# Patient Record
Sex: Female | Born: 2005 | Hispanic: Yes | Marital: Single | Smoking: Never smoker
Health system: Southern US, Community
[De-identification: ages and names within clinical notes are randomized; demographics above are authoritative.]

## PROBLEM LIST (undated history)

## (undated) DIAGNOSIS — J45909 Unspecified asthma, uncomplicated: Secondary | ICD-10-CM

## (undated) DIAGNOSIS — H7290 Unspecified perforation of tympanic membrane, unspecified ear: Secondary | ICD-10-CM

## (undated) DIAGNOSIS — H9190 Unspecified hearing loss, unspecified ear: Secondary | ICD-10-CM

---
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
## 2023-08-07 ENCOUNTER — Ambulatory Visit (HOSPITAL_COMMUNITY)
Admission: RE | Admit: 2023-08-07 | Discharge: 2023-08-07 | Disposition: A | Discharge status: Home / Self Care | Payer: Medicaid Other | Source: Ambulatory Visit | Attending: Family Medicine | Admitting: Family Medicine

## 2023-08-07 ENCOUNTER — Encounter (HOSPITAL_COMMUNITY): Payer: Self-pay

## 2023-08-07 ENCOUNTER — Ambulatory Visit (HOSPITAL_COMMUNITY): Payer: Self-pay

## 2023-08-07 VITALS — BP 115/79 | HR 90 | Temp 98.8°F | Resp 16

## 2023-08-07 DIAGNOSIS — J019 Acute sinusitis, unspecified: Secondary | ICD-10-CM

## 2023-08-07 MED ORDER — BENZONATATE 100 MG PO CAPS: 100.0000 mg | ORAL_CAPSULE | Freq: Three times a day (TID) | ORAL | 0 refills | Status: DC | PRN

## 2023-08-07 MED ORDER — DOXYCYCLINE HYCLATE 100 MG PO CAPS: 100.0000 mg | ORAL_CAPSULE | Freq: Two times a day (BID) | ORAL | 0 refills | Status: AC

## 2023-08-07 NOTE — ED Provider Notes (Signed)
 [facility]    CSN: [id number] Arrival date & time: 08/07/23  1049      History   Chief Complaint Chief Complaint  Patient presents with   Cough    Entered by patient   Sore Throat   Headache    HPI Toni Burgess is a 17 y.o. female.    Cough Associated symptoms: headaches   Sore Throat Associated symptoms include headaches.  Headache Associated symptoms: cough   Here for cough and congestion and headache.  She has also had some mild sore throat.  Symptoms began December 25.  She has had some subjective fever but first, it improved and then she had fever again last night.  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   Past Surgical History:  Procedure Laterality Date   INCISION AND DRAINAGE ABSCESS Left 11/21/2012   Procedure: EXCISION OF HMJWILONFJ BEHIND LEFT EAR;  Surgeon: Ida Loader, MD;  Location: [facility] OR;  Service: ENT;  Laterality: Left;   TYMPANOPLASTY  08/12/2012   Procedure: TYMPANOPLASTY;  Surgeon: Ida Loader, MD;  Location: [facility];  Service: ENT;  Laterality: Left;   TYMPANOSTOMY TUBE PLACEMENT  11/07/2004    OB History   No obstetric history on file.      Home Medications    Prior to Admission medications   Medication Sig Start Date End Date Taking? Authorizing Provider  benzonatate  (TESSALON ) 100 MG capsule Take 1 capsule (100 mg total) by mouth 3 (three) times daily as needed for cough. 08/07/23  Yes Vonna Sharlet POUR, MD  doxycycline  (VIBRAMYCIN ) 100 MG capsule Take 1 capsule (100 mg total) by mouth 2 (two) times daily for 7 days. 08/07/23 08/14/23 Yes Jakia Kennebrew, Sharlet POUR, MD    Family  History Family History  Problem Relation Age of Onset   Healthy Mother    Healthy Father     Social History Social History   Tobacco Use   Smoking status: Never   Smokeless tobacco: Never  Vaping Use   Vaping status: Never Used  Substance Use Topics   Alcohol use: Never   Drug use: Never     Allergies   Patient has no known allergies.   Review of Systems Review of Systems  Respiratory:  Positive for cough.   Neurological:  Positive for headaches.     Physical Exam Triage Vital Signs ED Triage Vitals  Encounter Vitals Group     BP 08/07/23 1118 115/79     Systolic BP Percentile --      Diastolic BP Percentile --      Pulse Rate 08/07/23 1118 90     Resp 08/07/23 1118 16     Temp 08/07/23 1118 98.8 F (37.1 C)     Temp Source 08/07/23 1118 Oral     SpO2 08/07/23 1118 96 %     Weight --      Height --      Head Circumference --      Peak Flow --      Pain Score 08/07/23 1116 0     Pain  Loc --      Pain Education --      Exclude from Growth Chart --    No data found.  Updated Vital Signs BP 115/79 (BP Location: Left Arm)   Pulse 90   Temp 98.8 F (37.1 C) (Oral)   Resp 16   LMP 07/23/2023   SpO2 96%   Visual Acuity Right Eye Distance:   Left Eye Distance:   Bilateral Distance:    Right Eye Near:   Left Eye Near:    Bilateral Near:     Physical Exam Vitals reviewed.  Constitutional:      General: She is not in acute distress.    Appearance: She is not ill-appearing, toxic-appearing or diaphoretic.  HENT:     Right Ear: Tympanic membrane and ear canal normal.     Left Ear: Tympanic membrane and ear canal normal.     Nose: Congestion present.     Mouth/Throat:     Mouth: Mucous membranes are moist.     Comments: There is clear mucus draining in the oropharynx Eyes:     Extraocular Movements: Extraocular movements intact.     Conjunctiva/sclera: Conjunctivae normal.     Pupils: Pupils are equal, round, and reactive to light.   Cardiovascular:     Rate and Rhythm: Normal rate and regular rhythm.     Heart sounds: No murmur heard. Pulmonary:     Effort: No respiratory distress.     Breath sounds: No stridor. No wheezing, rhonchi or rales.     Comments: Air movement overall is good but there are decreased breath sounds in the right lower lung field Musculoskeletal:     Cervical back: Neck supple.  Lymphadenopathy:     Cervical: No cervical adenopathy.  Skin:    Capillary Refill: Capillary refill takes less than 2 seconds.     Coloration: Skin is not jaundiced or pale.  Neurological:     General: No focal deficit present.     Mental Status: She is alert and oriented to person, place, and time.  Psychiatric:        Behavior: Behavior normal.      UC Treatments / Results  Labs (all labs ordered are listed, but only abnormal results are displayed) Labs Reviewed - No data to display  EKG   Radiology No results found.  Procedures Procedures (including critical care time)  Medications Ordered in UC Medications - No data to display  Initial Impression / Assessment and Plan / UC Course  I have reviewed the triage vital signs and the nursing notes.  Pertinent labs & imaging results that were available during my care of the patient were reviewed by me and considered in my medical decision making (see chart for details).     With nearly a week's worth of symptoms and frontal headache, I am inclined to treat for acute sinusitis.  Going to treat with doxycycline  to cover any potential pneumonia, since her decreased breath sounds on the right  Tessalon  Perles are sent in for cough Final Clinical Impressions(s) / UC Diagnoses   Final diagnoses:  Acute sinusitis, recurrence not specified, unspecified location     Discharge Instructions      Take doxycycline  100 mg --1 capsule 2 times daily for 7 days  Take benzonatate  100 mg, 1 tab every 8 hours as needed for cough.       ED Prescriptions      Medication Sig Dispense Auth. Provider   doxycycline  (VIBRAMYCIN ) 100  MG capsule Take 1 capsule (100 mg total) by mouth 2 (two) times daily for 7 days. 14 capsule Ronnie Mallette K, MD   benzonatate  (TESSALON ) 100 MG capsule Take 1 capsule (100 mg total) by mouth 3 (three) times daily as needed for cough. 21 capsule Izel Hochberg K, MD      PDMP not reviewed this encounter.   Vonna Sharlet POUR, MD 08/07/23 [phone]

## 2023-08-07 NOTE — ED Triage Notes (Signed)
 Pt complains of sore throat, headache, cough since 08/01/2023. She has been taking dayquil and nyquil with some relief.   She denies any pain currently.

## 2023-08-07 NOTE — Discharge Instructions (Signed)
Take doxycycline 100 mg --1 capsule 2 times daily for 7 days  Take benzonatate 100 mg, 1 tab every 8 hours as needed for cough.

## 2024-09-09 ENCOUNTER — Emergency Department (HOSPITAL_COMMUNITY)

## 2024-09-09 ENCOUNTER — Other Ambulatory Visit: Payer: Self-pay

## 2024-09-09 ENCOUNTER — Observation Stay (HOSPITAL_COMMUNITY)
Admission: EM | Admit: 2024-09-09 | Discharge: 2024-09-10 | Disposition: A | Attending: Obstetrics & Gynecology | Admitting: Obstetrics & Gynecology

## 2024-09-09 ENCOUNTER — Encounter (HOSPITAL_COMMUNITY): Payer: Self-pay

## 2024-09-09 DIAGNOSIS — N83209 Unspecified ovarian cyst, unspecified side: Secondary | ICD-10-CM | POA: Diagnosis present

## 2024-09-09 DIAGNOSIS — J45909 Unspecified asthma, uncomplicated: Secondary | ICD-10-CM | POA: Insufficient documentation

## 2024-09-09 DIAGNOSIS — N83202 Unspecified ovarian cyst, left side: Principal | ICD-10-CM | POA: Insufficient documentation

## 2024-09-09 DIAGNOSIS — N83201 Unspecified ovarian cyst, right side: Principal | ICD-10-CM

## 2024-09-09 LAB — URINALYSIS, ROUTINE W REFLEX MICROSCOPIC
Bacteria, UA: NONE SEEN
Bilirubin Urine: NEGATIVE
Glucose, UA: NEGATIVE mg/dL
Hgb urine dipstick: NEGATIVE
Ketones, ur: 5 mg/dL — AB
Leukocytes,Ua: NEGATIVE
Nitrite: NEGATIVE
Protein, ur: 30 mg/dL — AB
Specific Gravity, Urine: 1.02 (ref 1.005–1.030)
pH: 9 — ABNORMAL HIGH (ref 5.0–8.0)

## 2024-09-09 LAB — CBC
HCT: 37.1 % (ref 36.0–46.0)
Hemoglobin: 12.3 g/dL (ref 12.0–15.0)
MCH: 32.1 pg (ref 26.0–34.0)
MCHC: 33.2 g/dL (ref 30.0–36.0)
MCV: 96.9 fL (ref 80.0–100.0)
Platelets: 239 10*3/uL (ref 150–400)
RBC: 3.83 MIL/uL — ABNORMAL LOW (ref 3.87–5.11)
RDW: 12.2 % (ref 11.5–15.5)
WBC: 6.7 10*3/uL (ref 4.0–10.5)
nRBC: 0 % (ref 0.0–0.2)

## 2024-09-09 LAB — COMPREHENSIVE METABOLIC PANEL WITH GFR
ALT: 25 U/L (ref 0–44)
AST: 22 U/L (ref 15–41)
Albumin: 4.6 g/dL (ref 3.5–5.0)
Alkaline Phosphatase: 65 U/L (ref 38–126)
Anion gap: 12 (ref 5–15)
BUN: 17 mg/dL (ref 6–20)
CO2: 25 mmol/L (ref 22–32)
Calcium: 9.8 mg/dL (ref 8.9–10.3)
Chloride: 102 mmol/L (ref 98–111)
Creatinine, Ser: 0.66 mg/dL (ref 0.44–1.00)
GFR, Estimated: >60 mL/min
Glucose, Bld: 129 mg/dL — ABNORMAL HIGH (ref 70–99)
Potassium: 3.4 mmol/L — ABNORMAL LOW (ref 3.5–5.1)
Sodium: 139 mmol/L (ref 135–145)
Total Bilirubin: 0.3 mg/dL (ref 0.0–1.2)
Total Protein: 7.4 g/dL (ref 6.5–8.1)

## 2024-09-09 LAB — HCG, SERUM, QUALITATIVE: Preg, Serum: NEGATIVE

## 2024-09-09 LAB — LIPASE, BLOOD: Lipase: 28 U/L (ref 11–51)

## 2024-09-09 MED ORDER — FENTANYL CITRATE (PF) 50 MCG/ML IJ SOSY
50.0000 ug | PREFILLED_SYRINGE | Freq: Once | INTRAMUSCULAR | Status: AC
  Administered 2024-09-09: 50 ug via INTRAVENOUS
  Filled 2024-09-09: qty 1

## 2024-09-09 MED ORDER — KETOROLAC TROMETHAMINE 30 MG/ML IJ SOLN
30.0000 mg | Freq: Once | INTRAMUSCULAR | Status: AC
  Administered 2024-09-09: 30 mg via INTRAVENOUS
  Filled 2024-09-09: qty 1

## 2024-09-09 MED ORDER — HYDROMORPHONE HCL 1 MG/ML IJ SOLN
0.5000 mg | Freq: Once | INTRAMUSCULAR | Status: AC
  Administered 2024-09-09: 0.5 mg via INTRAVENOUS
  Filled 2024-09-09: qty 1

## 2024-09-09 MED ORDER — IBUPROFEN 600 MG PO TABS: 600.0000 mg | ORAL_TABLET | Freq: Four times a day (QID) | ORAL | 3 refills | Status: AC | PRN

## 2024-09-09 MED ORDER — ONDANSETRON HCL 4 MG/2ML IJ SOLN
4.0000 mg | Freq: Once | INTRAMUSCULAR | Status: AC
  Administered 2024-09-09: 4 mg via INTRAVENOUS
  Filled 2024-09-09: qty 2

## 2024-09-09 MED ORDER — OXYCODONE-ACETAMINOPHEN 5-325 MG PO TABS
2.0000 | ORAL_TABLET | Freq: Once | ORAL | Status: AC
  Administered 2024-09-09: 2 via ORAL
  Filled 2024-09-09: qty 2

## 2024-09-09 MED ORDER — ZOLPIDEM TARTRATE 5 MG PO TABS: 5.0000 mg | ORAL_TABLET | Freq: Every evening | ORAL | Status: DC | PRN

## 2024-09-09 MED ORDER — HYDROMORPHONE HCL 1 MG/ML IJ SOLN
1.0000 mg | Freq: Once | INTRAMUSCULAR | Status: AC
  Administered 2024-09-09: 1 mg via INTRAVENOUS
  Filled 2024-09-09: qty 1

## 2024-09-09 MED ORDER — IOHEXOL 350 MG/ML SOLN
75.0000 mL | Freq: Once | INTRAVENOUS | Status: AC | PRN
  Administered 2024-09-09: 75 mL via INTRAVENOUS

## 2024-09-09 MED ORDER — IBUPROFEN 800 MG PO TABS: 800.0000 mg | ORAL_TABLET | Freq: Three times a day (TID) | ORAL | Status: DC | PRN

## 2024-09-09 MED ORDER — LACTATED RINGERS IV SOLN: INTRAVENOUS | Status: DC

## 2024-09-09 MED ORDER — KETOROLAC TROMETHAMINE 30 MG/ML IJ SOLN
15.0000 mg | Freq: Once | INTRAMUSCULAR | Status: AC
  Administered 2024-09-09: 15 mg via INTRAVENOUS
  Filled 2024-09-09: qty 1

## 2024-09-09 MED ORDER — OXYCODONE-ACETAMINOPHEN 5-325 MG PO TABS
1.0000 | ORAL_TABLET | Freq: Four times a day (QID) | ORAL | Status: DC | PRN
  Administered 2024-09-09: 2 via ORAL
  Administered 2024-09-10: 1 via ORAL
  Filled 2024-09-09: qty 1
  Filled 2024-09-09: qty 2

## 2024-09-09 MED ORDER — ONDANSETRON HCL 4 MG/2ML IJ SOLN: 4.0000 mg | Freq: Four times a day (QID) | INTRAMUSCULAR | Status: DC | PRN

## 2024-09-09 MED ORDER — PRENATAL MULTIVITAMIN CH: 1.0000 | ORAL_TABLET | Freq: Every day | ORAL | Status: DC

## 2024-09-09 MED ORDER — ONDANSETRON HCL 4 MG PO TABS: 4.0000 mg | ORAL_TABLET | Freq: Four times a day (QID) | ORAL | Status: DC | PRN

## 2024-09-09 MED ORDER — HYDROMORPHONE HCL 1 MG/ML IJ SOLN: 0.2000 mg | INTRAMUSCULAR | Status: DC | PRN

## 2024-09-09 NOTE — ED Notes (Signed)
 Surgery at [facility]

## 2024-09-09 NOTE — ED Notes (Signed)
 Pt. Stated that she does not need to use restroom at this time; water provided to help process; Pt. Asked to use call bell when she feels the need to go

## 2024-09-09 NOTE — ED Triage Notes (Signed)
 Pt coming in reporting pain in her left lower quadrant. Pt reporting that pain woke her from her sleep . Pt reports taking Advil  and this did not help her pain. Pt reports nausea and one episode of emesis. Pt denies any chance she could be pregnant at this time.

## 2024-09-09 NOTE — ED Notes (Signed)
 Patient transported to CT

## 2024-09-10 LAB — CBC
HCT: 31.4 % — ABNORMAL LOW (ref 36.0–46.0)
Hemoglobin: 10.6 g/dL — ABNORMAL LOW (ref 12.0–15.0)
MCH: 32.3 pg (ref 26.0–34.0)
MCHC: 33.8 g/dL (ref 30.0–36.0)
MCV: 95.7 fL (ref 80.0–100.0)
Platelets: 199 10*3/uL (ref 150–400)
RBC: 3.28 MIL/uL — ABNORMAL LOW (ref 3.87–5.11)
RDW: 12.5 % (ref 11.5–15.5)
WBC: 8.2 10*3/uL (ref 4.0–10.5)
nRBC: 0 % (ref 0.0–0.2)

## 2024-09-10 MED ORDER — OXYCODONE HCL 5 MG PO TABS: 5.0000 mg | ORAL_TABLET | Freq: Four times a day (QID) | ORAL | 0 refills | Status: AC | PRN

## 2024-09-10 NOTE — Discharge Summary (Signed)
 Physician Discharge Summary  Patient ID: Toni Burgess MRN: [id number] DOB/AGE: 27-Apr-2006 18 y.o.  Admit date: 09/09/2024 Discharge date: 09/10/2024  Admission Diagnoses:Pelvic pain, right ovarian cyst  Discharge Diagnoses:  Principal Problem:   Ovarian cyst   Discharged Condition: fair  Hospital Course: Toni Burgess is an 19 y.o. female.G0, No LMP recorded (lmp unknown). 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 No nausea, vomiting or fever. US  and CT showed simple left ovarian cyst 10x8 cm. She continued to have episodes of severe pain and has had doses of dilaudid  in the ED. She was admitted for observation and pain management. Overnight her pain was managed with oral medication and she was ready to go home.  Consults: gynecology to the ED  Significant Diagnostic Studies: labs: CBC and radiology: CT scan: abdomen and pelvis and Ultrasound: Pelvic  Treatments: IV hydration and analgesia: Dilaudid  and Toradol , ibuprofen   Discharge Exam: Blood pressure 111/73, pulse 83, temperature 98 F (36.7 C), temperature source Oral, resp. rate 16, height 5' 1 (1.549 m), weight 53.1 kg, SpO2 100%. General appearance: alert, cooperative, and no distress Resp: normal effort Cardio: regular rate and rhythm GI: minimal RLQ tenderness CBC    Component Value Date/Time   WBC 8.2 09/10/2024 0731   RBC 3.28 (L) 09/10/2024 0731   HGB 10.6 (L) 09/10/2024 0731   HCT 31.4 (L) 09/10/2024 0731   PLT 199 09/10/2024 0731   MCV 95.7 09/10/2024 0731   MCH 32.3 09/10/2024 0731   MCHC 33.8 09/10/2024 0731   RDW 12.5 09/10/2024 0731   CLINICAL DATA:  Right lower quadrant pain.  EXAM: TRANSABDOMINAL ULTRASOUND OF PELVIS  DOPPLER ULTRASOUND OF OVARIES  TECHNIQUE: Transabdominal ultrasound examination of the pelvis was performed including evaluation of the uterus, ovaries, adnexal regions, and ...         Study Result  Narrative & Impression  CLINICAL DATA:  Right lower quadrant pain.   EXAM: TRANSABDOMINAL ULTRASOUND OF PELVIS   DOPPLER ULTRASOUND OF OVARIES   TECHNIQUE: Transabdominal ultrasound examination of the pelvis was performed including evaluation of the uterus, ovaries, adnexal regions, and pelvic cul-de-sac.   Color and duplex Doppler ultrasound was utilized to evaluate blood flow to the ovaries.   COMPARISON:  None Available.   FINDINGS: Uterus   Measurements: 6.5 x 2.6 x 4.3 cm = volume: 38 mL. No fibroids or other mass visualized.   Endometrium   Thickness: Normal, 7 mm.  No focal abnormality visualized.   Right ovary   Measurements: 10.6 x 8.9 x 11.1 cm = volume: 555 mL. A cystic lesion within measures 7.3 x 9.9 x 10.1 cm. Primarily simple in appearance. Apparent dependent low-level echoes are favored to be artifactual. Doppler: There is normal vascularity on color doppler examination. Spectral doppler arterial and venous waveforms are normal.   Left ovary   Measurements: 4.5 x 2.7 x 2.9 cm = volume: 18 mL. Normal appearance/no adnexal mass. Doppler: There is normal vascularity on color doppler examination. Spectral doppler arterial and venous waveforms are normal.   Other: No significant free fluid.   IMPRESSION: No evidence of ovarian/adnexal torsion.   Dominant right ovarian cystic lesion, favored to be simple and represent a simple cyst. Per consensus criteria, given lesion size, low-grade neoplasm cannot be excluded. Consider gynecological consult and further imaging evaluation with pre and post-contrast pelvic MRI.   This recommendation follows the consensus statement: Management  of Asymptomatic Ovarian and Other Adnexal Cysts Imaged at US : Society of Radiologists in Ultrasound Consensus Conference Statement. Radiology 2010; [phone].     Electronically Signed   By: Rockey Kilts M.D.   On: 09/09/2024 08:46     Disposition: Discharge disposition: 01-Home or Self Care       Discharge Instructions     Discharge patient   Complete by: As directed    Discharge disposition: 01-Home or Self Care   Discharge patient date: 09/10/2024      Allergies as of 09/10/2024   No Known Allergies      Medication List     STOP taking these medications    benzonatate  100 MG capsule Commonly known as: TESSALON        TAKE these medications    ibuprofen  600 MG tablet Commonly known as: ADVIL  Take 1 tablet (600 mg total) by mouth every 6 (six) hours as needed.   oxyCODONE  5 MG immediate release tablet Commonly known as: Oxy IR/ROXICODONE  Take 1-2 tablets (5-10 mg total) by mouth every 6 (six) hours as needed for severe pain (pain score 7-10) or breakthrough pain.        Follow-up Information     [facility]. Schedule an appointment as soon as possible for a visit .   Specialty: Obstetrics and Gynecology Contact information: [address] [place]  [address] [phone]                Signed: Lynwood Solomons 09/10/2024, 9:55 AM

## 2024-09-10 NOTE — Plan of Care (Signed)

## 2024-09-10 NOTE — Plan of Care (Signed)
" °  Problem: Education: Goal: Knowledge of General Education information will improve Description: Including pain rating scale, medication(s)/side effects and non-pharmacologic comfort measures 09/10/2024 0947 by Arlys Alan RAMAN, RN Outcome: Completed/Met 09/10/2024 0721 by Arlys Alan RAMAN, RN Outcome: Progressing   Problem: Health Behavior/Discharge Planning: Goal: Ability to manage health-related needs will improve 09/10/2024 0947 by Arlys Alan RAMAN, RN Outcome: Completed/Met 09/10/2024 0721 by Arlys Alan RAMAN, RN Outcome: Progressing   Problem: Clinical Measurements: Goal: Ability to maintain clinical measurements within normal limits will improve 09/10/2024 0947 by Arlys Alan RAMAN, RN Outcome: Completed/Met 09/10/2024 0721 by Arlys Alan RAMAN, RN Outcome: Progressing Goal: Will remain free from infection 09/10/2024 0947 by Arlys Alan RAMAN, RN Outcome: Completed/Met 09/10/2024 0721 by Arlys Alan RAMAN, RN Outcome: Progressing Goal: Diagnostic test results will improve 09/10/2024 0947 by Arlys Alan RAMAN, RN Outcome: Completed/Met 09/10/2024 0721 by Arlys Alan RAMAN, RN Outcome: Progressing Goal: Respiratory complications will improve 09/10/2024 0947 by Arlys Alan RAMAN, RN Outcome: Completed/Met 09/10/2024 0721 by Arlys Alan RAMAN, RN Outcome: Progressing Goal: Cardiovascular complication will be avoided 09/10/2024 0947 by Arlys Alan RAMAN, RN Outcome: Completed/Met 09/10/2024 0721 by Arlys Alan RAMAN, RN Outcome: Progressing   Problem: Activity: Goal: Risk for activity intolerance will decrease 09/10/2024 0947 by Arlys Alan RAMAN, RN Outcome: Completed/Met 09/10/2024 0721 by Arlys Alan RAMAN, RN Outcome: Progressing   Problem: Nutrition: Goal: Adequate nutrition will be maintained 09/10/2024 0947 by Arlys Alan RAMAN, RN Outcome: Completed/Met 09/10/2024 0721 by Arlys Alan RAMAN, RN Outcome: Progressing   Problem: Coping: Goal: Level of anxiety will decrease 09/10/2024 0947 by Arlys Alan RAMAN, RN Outcome: Completed/Met 09/10/2024 0721 by Arlys Alan RAMAN, RN Outcome: Progressing   Problem: Elimination: Goal: Will not experience complications related to bowel motility 09/10/2024 0947 by Arlys Alan RAMAN, RN Outcome: Completed/Met 09/10/2024 0721 by Arlys Alan RAMAN, RN Outcome: Progressing Goal: Will not experience complications related to urinary retention 09/10/2024 0947 by Arlys Alan RAMAN, RN Outcome: Completed/Met 09/10/2024 0721 by Arlys Alan RAMAN, RN Outcome: Progressing   Problem: Pain Managment: Goal: General experience of comfort will improve and/or be controlled 09/10/2024 0947 by Arlys Alan RAMAN, RN Outcome: Completed/Met 09/10/2024 0721 by Arlys Alan RAMAN, RN Outcome: Progressing   Problem: Safety: Goal: Ability to remain free from injury will improve 09/10/2024 0947 by Arlys Alan RAMAN, RN Outcome: Completed/Met 09/10/2024 0721 by Arlys Alan RAMAN, RN Outcome: Progressing   Problem: Skin Integrity: Goal: Risk for impaired skin integrity will decrease 09/10/2024 0947 by Arlys Alan RAMAN, RN Outcome: Completed/Met 09/10/2024 0721 by Arlys Alan RAMAN, RN Outcome: Progressing   "

## 2024-10-01 ENCOUNTER — Ambulatory Visit: Payer: Self-pay | Admitting: Obstetrics & Gynecology
